# Patient Record
Sex: Male | Born: 1940 | Race: White | Hispanic: No | Marital: Married | Smoking: Never smoker
Health system: Southern US, Community
[De-identification: ages and names within clinical notes are randomized; demographics above are authoritative.]

## PROBLEM LIST (undated history)

## (undated) HISTORY — PX: TONSILLECTOMY: SUR1361

## (undated) HISTORY — PX: CHOLECYSTECTOMY: SHX55

---

## 2007-08-20 ENCOUNTER — Ambulatory Visit: Payer: Self-pay | Admitting: Internal Medicine

## 2017-12-17 ENCOUNTER — Ambulatory Visit (INDEPENDENT_AMBULATORY_CARE_PROVIDER_SITE_OTHER): Payer: Medicare PPO

## 2017-12-17 ENCOUNTER — Ambulatory Visit
Admission: EM | Admit: 2017-12-17 | Discharge: 2017-12-17 | Disposition: A | Discharge status: Home / Self Care | Payer: Medicare PPO | Attending: Emergency Medicine | Admitting: Emergency Medicine

## 2017-12-17 ENCOUNTER — Other Ambulatory Visit: Payer: Self-pay

## 2017-12-17 DIAGNOSIS — R51 Headache: Secondary | ICD-10-CM | POA: Diagnosis not present

## 2017-12-17 DIAGNOSIS — R062 Wheezing: Secondary | ICD-10-CM | POA: Diagnosis not present

## 2017-12-17 DIAGNOSIS — R05 Cough: Secondary | ICD-10-CM | POA: Diagnosis not present

## 2017-12-17 DIAGNOSIS — J22 Unspecified acute lower respiratory infection: Secondary | ICD-10-CM

## 2017-12-17 DIAGNOSIS — R0989 Other specified symptoms and signs involving the circulatory and respiratory systems: Secondary | ICD-10-CM | POA: Diagnosis not present

## 2017-12-17 MED ORDER — IPRATROPIUM-ALBUTEROL 0.5-2.5 (3) MG/3ML IN SOLN
3.0000 mL | Freq: Once | RESPIRATORY_TRACT | Status: AC
  Administered 2017-12-17: 3 mL via RESPIRATORY_TRACT

## 2017-12-17 MED ORDER — AZITHROMYCIN 250 MG PO TABS: 250.0000 mg | ORAL_TABLET | Freq: Every day | ORAL | 0 refills | Status: DC

## 2017-12-17 MED ORDER — PREDNISONE 50 MG PO TABS
60.0000 mg | ORAL_TABLET | Freq: Once | ORAL | Status: AC
  Administered 2017-12-17: 60 mg via ORAL

## 2017-12-17 MED ORDER — ALBUTEROL SULFATE HFA 108 (90 BASE) MCG/ACT IN AERS: 1.0000 | INHALATION_SPRAY | Freq: Four times a day (QID) | RESPIRATORY_TRACT | 0 refills | Status: DC | PRN

## 2017-12-17 MED ORDER — AEROCHAMBER PLUS MISC: 2 refills | Status: DC

## 2017-12-17 MED ORDER — PREDNISONE 20 MG PO TABS: 40.0000 mg | ORAL_TABLET | Freq: Every day | ORAL | 0 refills | Status: AC

## 2017-12-17 NOTE — ED Triage Notes (Signed)
Patient complains of cough and congestion x 1 week with worsening symptoms.

## 2017-12-17 NOTE — Discharge Instructions (Addendum)
2 puffs every 4-6 hours from your albuterol inhaler using your spacer, start some saline nasal irrigation with a Lloyd HugerNeil med sinus rinse and distilled water, 40 mg prednisone for an additional 5 days and start this tomorrow.  wait-and-see prescription of azithromycin to cover pneumonia if not better with these medications in 3 days or if you start having fevers above 100.4.  Here is a list of primary care providers who are taking new patients:  Dr. Elizabeth Sauereanna Jones, Dr. Schuyler AmorWilliam Plonk [address] [place] [address] [phone]  [facility] [place] [address] [place] [address]  [place] [address]  [phone]  [facility] [address]  [place], [place] [address] [phone]  [facility] [place] [address]  [phone] [place], [place] [address]  Here are clinics/ other resources who will see you if you do not have insurance. Some have certain criteria that you must meet. Call them and find out what they are:  [facility]: [address] [place] [place], [place] [address] Phone: [phone] Hours: First and Third Saturdays of each Month, 9 a.m. - 1 p.m.  [facility]: [address], [place], [place] [address] Phone: [phone] Hours: Tuesday, 4 p.m. - 8 p.m. Thursday, 1 p.m. - 8 p.m. Wednesday, 9 a.m. - [facility]  [facility] [address], [place], [place] [address] Phone: [phone] Pharmacy Phone Number: [phone] Dental Phone Number: [phone] [facility] Insurance Help: [phone]  Dental Hours: Monday - Thursday, 8 a.m. - 6 p.m.  [facility] [address] [place], [place] [address] Phone: [phone] Pharmacy Phone Number: [phone] [facility] Help: [phone]  [facility] [address] [place], [place] [address] Phone: [phone] Pharmacy Phone Number: [phone] [facility] Insurance Help: [phone]  [facility] [address] [place], [place] [address] Phone: [phone] [facility] Insurance Help: [phone]   [facility] [address] [place], [place] [address] Phone: [phone]  Go to www.goodrx.com to look up your medications. This will give you a list of where you can find your prescriptions at the most affordable prices. Or ask the pharmacist what the cash price is, or if they have any other discount programs available to help make your medication more affordable. This can be less expensive than what you would pay with insurance.

## 2017-12-17 NOTE — ED Provider Notes (Signed)
HPI  SUBJECTIVE:  Jacob Zhang is a 77 y.o. male who presents with 1 week of postnasal drip, cough productive of greenish sputum and a mild headache with coughing only.  He denies fevers, sinus pain or pressure, nasal congestion, rhinorrhea, allergy symptoms.  He denies wheezing, chest pain, shortness of breath.  No dyspnea on exertion, body aches, fatigue.  He is able to sleep well at night with NyQuil.  No antibiotics in the past month.  No antipyretic in the past 6 to 8 hours.  No chlorpheniramine-containing products.  No sick contacts.  He has tried NyQuil cold and flu, Mucinex with improvement in symptoms.  No aggravating factors.  Past medical history negative for diabetes, hypertension, asthma emphysema, COPD, smoking, pneumonia, kidney disease.  He is not sure if he has history of allergies.  PMD: None.    History reviewed. No pertinent past medical history.  Past Surgical History:  Procedure Laterality Date  . CHOLECYSTECTOMY    . TONSILLECTOMY      History reviewed. No pertinent family history.  Social History   Tobacco Use  . Smoking status: Never Smoker  . Smokeless tobacco: Never Used  Substance Use Topics  . Alcohol use: Never    Frequency: Never  . Drug use: Never    No current facility-administered medications for this encounter.   Current Outpatient Medications:  .  albuterol (PROVENTIL HFA;VENTOLIN HFA) 108 (90 Base) MCG/ACT inhaler, Inhale 1-2 puffs into the lungs every 6 (six) hours as needed for wheezing or shortness of breath., Disp: 1 Inhaler, Rfl: 0 .  azithromycin (ZITHROMAX) 250 MG tablet, Take 1 tablet (250 mg total) by mouth daily. 2 tabs po on day 1, 1 tab po on days 2-5, Disp: 6 tablet, Rfl: 0 .  predniSONE (DELTASONE) 20 MG tablet, Take 2 tablets (40 mg total) by mouth daily with breakfast for 5 days., Disp: 10 tablet, Rfl: 0 .  Spacer/Aero-Holding Chambers (AEROCHAMBER PLUS) inhaler, Use as instructed, Disp: 1 each, Rfl: 2  No Known  Allergies   ROS  As noted in HPI.   Physical Exam  BP (!) 150/81 (BP Location: Right Arm)   Pulse 78   Temp 98.4 F (36.9 C) (Oral)   Resp 18   Ht 5\' 9"  (1.753 m)   Wt 160 lb (72.6 kg)   SpO2 99%   BMI 23.63 kg/m   Constitutional: Well developed, well nourished, no acute distress Eyes:  EOMI, conjunctiva normal bilaterally HENT: Normocephalic, atraumatic,mucus membranes moist.  Positive clear rhinorrhea.  Erythematous, swollen turbinates.  No sinus tenderness.  Positive cobblestoning.  No postnasal drip. Respiratory: Normal inspiratory effort, diffuse wheezing throughout all lung fields, occasional rhonchi.   Cardiovascular: Normal rate regular rhythm, no murmurs, rubs, gallops. GI: nondistended skin: No rash, skin intact Musculoskeletal: no deformities Neurologic: Alert & oriented x 3, no focal neuro deficits Psychiatric: Speech and behavior appropriate   ED Course   Medications  ipratropium-albuterol (DUONEB) 0.5-2.5 (3) MG/3ML nebulizer solution 3 mL (3 mLs Nebulization Given 12/17/17 1601)  predniSONE (DELTASONE) tablet 60 mg (60 mg Oral Given 12/17/17 1601)    Orders Placed This Encounter  Procedures  . DG Chest 2 View    Standing Status:   Standing    Number of Occurrences:   1    Order Specific Question:   Reason for Exam (SYMPTOM  OR DIAGNOSIS REQUIRED)    Answer:   cough wheeze r/o pNA    No results found for this or any previous visit (from  the past 24 hour(s)). Dg Chest 2 View  Result Date: 12/17/2017 CLINICAL DATA:  Cough, wheezing for 1 week EXAM: CHEST - 2 VIEW COMPARISON:  None. FINDINGS: No active infiltrate or effusion is seen. Mediastinal and hilar contours are unremarkable. The heart is within upper limits of normal. No bony abnormality is seen. Surgical clips are present in the right upper quadrant from prior cholecystectomy. IMPRESSION: No active cardiopulmonary disease. Electronically Signed   By: Dwyane Dee M.D.   On: 12/17/2017 16:31    ED  Clinical Impression  Lower respiratory tract infection   ED Assessment/Plan  Presentation consistent with a lower respiratory infection.  Will give prednisone 60 mg, DuoNeb because of the wheezing and rhonchi, obtain a chest x-ray because of the focal lung findings and his age, and reevaluate.  Plan to send home with an albuterol inhaler with a spacer 2 puffs every 4-6 hours,  saline nasal irrigation, 40 mg prednisone for an additional 5 days and a wait-and-see prescription of azithromycin to cover pneumonia if not better with these medications.  Reviewed imaging independently.  No pneumonia.  See radiology report for full details.  On Reevaluation, patient states he feels better.  Wheezes at bilateral bases.  No rhonchi. plan as above.  Discussed imaging, MDM, treatment plan, and plan for follow-up with patient. Discussed sn/sx that should prompt return to the ED. patient agrees with plan.   Meds ordered this encounter  Medications  . ipratropium-albuterol (DUONEB) 0.5-2.5 (3) MG/3ML nebulizer solution 3 mL  . predniSONE (DELTASONE) tablet 60 mg  . albuterol (PROVENTIL HFA;VENTOLIN HFA) 108 (90 Base) MCG/ACT inhaler    Sig: Inhale 1-2 puffs into the lungs every 6 (six) hours as needed for wheezing or shortness of breath.    Dispense:  1 Inhaler    Refill:  0  . Spacer/Aero-Holding Chambers (AEROCHAMBER PLUS) inhaler    Sig: Use as instructed    Dispense:  1 each    Refill:  2  . predniSONE (DELTASONE) 20 MG tablet    Sig: Take 2 tablets (40 mg total) by mouth daily with breakfast for 5 days.    Dispense:  10 tablet    Refill:  0  . azithromycin (ZITHROMAX) 250 MG tablet    Sig: Take 1 tablet (250 mg total) by mouth daily. 2 tabs po on day 1, 1 tab po on days 2-5    Dispense:  6 tablet    Refill:  0    *This clinic note was created using Scientist, clinical (histocompatibility and immunogenetics). Therefore, there may be occasional mistakes despite careful proofreading.   ?   Domenick Gong, MD 12/17/17  1801

## 2020-04-08 ENCOUNTER — Other Ambulatory Visit: Payer: Self-pay | Admitting: Family Medicine

## 2020-04-08 ENCOUNTER — Other Ambulatory Visit (HOSPITAL_COMMUNITY): Payer: Self-pay | Admitting: Family Medicine

## 2020-04-08 DIAGNOSIS — R2232 Localized swelling, mass and lump, left upper limb: Secondary | ICD-10-CM

## 2020-04-12 ENCOUNTER — Ambulatory Visit
Admission: RE | Admit: 2020-04-12 | Discharge: 2020-04-12 | Disposition: A | Discharge status: Home / Self Care | Payer: Medicare PPO | Source: Ambulatory Visit | Attending: Family Medicine | Admitting: Family Medicine

## 2020-04-12 ENCOUNTER — Other Ambulatory Visit: Payer: Self-pay

## 2020-04-12 DIAGNOSIS — R2232 Localized swelling, mass and lump, left upper limb: Secondary | ICD-10-CM | POA: Diagnosis not present

## 2021-01-24 DIAGNOSIS — I209 Angina pectoris, unspecified: Secondary | ICD-10-CM | POA: Diagnosis present

## 2021-02-01 ENCOUNTER — Encounter: Payer: Self-pay | Admitting: Cardiology

## 2021-02-01 ENCOUNTER — Encounter
Admission: RE | Disposition: A | Discharge status: Home / Self Care | Payer: Self-pay | Source: Ambulatory Visit | Attending: Cardiology

## 2021-02-01 ENCOUNTER — Observation Stay
Admission: RE | Admit: 2021-02-01 | Discharge: 2021-02-02 | Disposition: A | Discharge status: Home / Self Care | Payer: Medicare PPO | Source: Ambulatory Visit | Attending: Cardiology | Admitting: Cardiology

## 2021-02-01 ENCOUNTER — Other Ambulatory Visit: Payer: Self-pay

## 2021-02-01 DIAGNOSIS — I2510 Atherosclerotic heart disease of native coronary artery without angina pectoris: Secondary | ICD-10-CM | POA: Diagnosis present

## 2021-02-01 DIAGNOSIS — I209 Angina pectoris, unspecified: Secondary | ICD-10-CM | POA: Diagnosis present

## 2021-02-01 DIAGNOSIS — R9430 Abnormal result of cardiovascular function study, unspecified: Secondary | ICD-10-CM

## 2021-02-01 DIAGNOSIS — R079 Chest pain, unspecified: Secondary | ICD-10-CM

## 2021-02-01 DIAGNOSIS — I10 Essential (primary) hypertension: Secondary | ICD-10-CM | POA: Diagnosis not present

## 2021-02-01 DIAGNOSIS — I25119 Atherosclerotic heart disease of native coronary artery with unspecified angina pectoris: Secondary | ICD-10-CM | POA: Diagnosis not present

## 2021-02-01 HISTORY — PX: LEFT HEART CATH AND CORONARY ANGIOGRAPHY: CATH118249

## 2021-02-01 HISTORY — PX: CORONARY STENT INTERVENTION: CATH118234

## 2021-02-01 LAB — POCT ACTIVATED CLOTTING TIME: Activated Clotting Time: 451 seconds

## 2021-02-01 SURGERY — LEFT HEART CATH AND CORONARY ANGIOGRAPHY
Anesthesia: Moderate Sedation

## 2021-02-01 MED ORDER — MIDAZOLAM HCL 2 MG/2ML IJ SOLN
INTRAMUSCULAR | Status: AC
  Filled 2021-02-01: qty 2

## 2021-02-01 MED ORDER — IOHEXOL 300 MG/ML  SOLN
INTRAMUSCULAR | Status: DC | PRN
Start: 2021-02-01 — End: 2021-02-01
  Administered 2021-02-01: 237 mL via INTRA_ARTERIAL

## 2021-02-01 MED ORDER — LABETALOL HCL 5 MG/ML IV SOLN: 10.0000 mg | INTRAVENOUS | Status: AC | PRN

## 2021-02-01 MED ORDER — ASPIRIN 81 MG PO CHEW
CHEWABLE_TABLET | ORAL | Status: DC | PRN
  Administered 2021-02-01: 243 mg via ORAL

## 2021-02-01 MED ORDER — SODIUM CHLORIDE 0.9 % IV SOLN: 250.0000 mL | INTRAVENOUS | Status: DC | PRN

## 2021-02-01 MED ORDER — TICAGRELOR 90 MG PO TABS
ORAL_TABLET | ORAL | Status: DC | PRN
  Administered 2021-02-01: 180 mg via ORAL

## 2021-02-01 MED ORDER — SODIUM CHLORIDE 0.9% FLUSH
3.0000 mL | Freq: Two times a day (BID) | INTRAVENOUS | Status: DC
  Administered 2021-02-02: 3 mL via INTRAVENOUS

## 2021-02-01 MED ORDER — LIDOCAINE HCL (PF) 1 % IJ SOLN
INTRAMUSCULAR | Status: AC
  Filled 2021-02-01: qty 30

## 2021-02-01 MED ORDER — ASPIRIN 81 MG PO CHEW
81.0000 mg | CHEWABLE_TABLET | ORAL | Status: AC
  Administered 2021-02-01: 81 mg via ORAL

## 2021-02-01 MED ORDER — HEPARIN (PORCINE) IN NACL 2000-0.9 UNIT/L-% IV SOLN
INTRAVENOUS | Status: DC | PRN
  Administered 2021-02-01: 1000 mL

## 2021-02-01 MED ORDER — FENTANYL CITRATE (PF) 100 MCG/2ML IJ SOLN
INTRAMUSCULAR | Status: DC | PRN
  Administered 2021-02-01: 25 ug via INTRAVENOUS

## 2021-02-01 MED ORDER — SODIUM CHLORIDE 0.9 % WEIGHT BASED INFUSION: 1.0000 mL/kg/h | INTRAVENOUS | Status: DC

## 2021-02-01 MED ORDER — LIDOCAINE HCL (PF) 1 % IJ SOLN
INTRAMUSCULAR | Status: DC | PRN
  Administered 2021-02-01: 2 mL

## 2021-02-01 MED ORDER — ASPIRIN 81 MG PO CHEW
CHEWABLE_TABLET | ORAL | Status: AC
  Filled 2021-02-01: qty 3

## 2021-02-01 MED ORDER — MIDAZOLAM HCL 2 MG/2ML IJ SOLN
INTRAMUSCULAR | Status: DC | PRN
  Administered 2021-02-01: 1 mg via INTRAVENOUS

## 2021-02-01 MED ORDER — HEPARIN SODIUM (PORCINE) 1000 UNIT/ML IJ SOLN
INTRAMUSCULAR | Status: DC | PRN
  Administered 2021-02-01: 7000 [IU] via INTRAVENOUS

## 2021-02-01 MED ORDER — NITROGLYCERIN 1 MG/10 ML FOR IR/CATH LAB
INTRA_ARTERIAL | Status: AC
  Filled 2021-02-01: qty 10

## 2021-02-01 MED ORDER — ATORVASTATIN CALCIUM 20 MG PO TABS
40.0000 mg | ORAL_TABLET | Freq: Every day | ORAL | Status: DC
  Administered 2021-02-02: 40 mg via ORAL
  Filled 2021-02-01 (×2): qty 2

## 2021-02-01 MED ORDER — SODIUM CHLORIDE 0.9% FLUSH: 3.0000 mL | INTRAVENOUS | Status: DC | PRN

## 2021-02-01 MED ORDER — HEPARIN SODIUM (PORCINE) 1000 UNIT/ML IJ SOLN
INTRAMUSCULAR | Status: AC
  Filled 2021-02-01: qty 1

## 2021-02-01 MED ORDER — NITROGLYCERIN 1 MG/10 ML FOR IR/CATH LAB
INTRA_ARTERIAL | Status: DC | PRN
  Administered 2021-02-01 (×2): 200 ug via INTRACORONARY

## 2021-02-01 MED ORDER — TICAGRELOR 90 MG PO TABS
90.0000 mg | ORAL_TABLET | Freq: Two times a day (BID) | ORAL | Status: DC
  Administered 2021-02-01 – 2021-02-02 (×2): 90 mg via ORAL
  Filled 2021-02-01 (×2): qty 1

## 2021-02-01 MED ORDER — SODIUM CHLORIDE 0.9% FLUSH
3.0000 mL | Freq: Two times a day (BID) | INTRAVENOUS | Status: DC
  Administered 2021-02-01: 3 mL via INTRAVENOUS

## 2021-02-01 MED ORDER — ASPIRIN 81 MG PO CHEW
CHEWABLE_TABLET | ORAL | Status: AC
  Filled 2021-02-01: qty 1

## 2021-02-01 MED ORDER — FENTANYL CITRATE (PF) 100 MCG/2ML IJ SOLN
INTRAMUSCULAR | Status: AC
  Filled 2021-02-01: qty 2

## 2021-02-01 MED ORDER — SODIUM CHLORIDE 0.9 % WEIGHT BASED INFUSION
3.0000 mL/kg/h | INTRAVENOUS | Status: AC
  Administered 2021-02-01: 3 mL/kg/h via INTRAVENOUS

## 2021-02-01 MED ORDER — IOHEXOL 300 MG/ML  SOLN
INTRAMUSCULAR | Status: DC | PRN
  Administered 2021-02-01: 56 mL

## 2021-02-01 MED ORDER — ACETAMINOPHEN 325 MG PO TABS: 650.0000 mg | ORAL_TABLET | ORAL | Status: DC | PRN

## 2021-02-01 MED ORDER — VERAPAMIL HCL 2.5 MG/ML IV SOLN
INTRAVENOUS | Status: DC | PRN
  Administered 2021-02-01: 2.5 mg via INTRAVENOUS

## 2021-02-01 MED ORDER — ONDANSETRON HCL 4 MG/2ML IJ SOLN: 4.0000 mg | Freq: Four times a day (QID) | INTRAMUSCULAR | Status: DC | PRN

## 2021-02-01 MED ORDER — TICAGRELOR 90 MG PO TABS
ORAL_TABLET | ORAL | Status: AC
  Filled 2021-02-01: qty 2

## 2021-02-01 MED ORDER — HEPARIN SODIUM (PORCINE) 1000 UNIT/ML IJ SOLN
INTRAMUSCULAR | Status: DC | PRN
  Administered 2021-02-01: 3500 [IU] via INTRAVENOUS

## 2021-02-01 MED ORDER — ASPIRIN 81 MG PO CHEW
81.0000 mg | CHEWABLE_TABLET | Freq: Every day | ORAL | Status: DC
  Administered 2021-02-02: 81 mg via ORAL
  Filled 2021-02-01: qty 1

## 2021-02-01 MED ORDER — HYDRALAZINE HCL 20 MG/ML IJ SOLN: 10.0000 mg | INTRAMUSCULAR | Status: AC | PRN

## 2021-02-01 MED ORDER — METOPROLOL SUCCINATE ER 25 MG PO TB24
25.0000 mg | ORAL_TABLET | Freq: Every day | ORAL | Status: DC
  Administered 2021-02-02: 25 mg via ORAL
  Filled 2021-02-01: qty 1

## 2021-02-01 MED ORDER — SODIUM CHLORIDE 0.9 % WEIGHT BASED INFUSION: 1.0000 mL/kg/h | INTRAVENOUS | Status: AC

## 2021-02-01 MED ORDER — VERAPAMIL HCL 2.5 MG/ML IV SOLN
INTRAVENOUS | Status: AC
  Filled 2021-02-01: qty 2

## 2021-02-01 MED ORDER — HEPARIN (PORCINE) IN NACL 1000-0.9 UT/500ML-% IV SOLN
INTRAVENOUS | Status: AC
  Filled 2021-02-01: qty 1000

## 2021-02-01 SURGICAL SUPPLY — 22 items
BALLN EUPHORA RX 2.0X15 (BALLOONS) ×2
BALLN TREK RX 2.25X15 (BALLOONS) ×2
BALLOON EUPHORA RX 2.0X15 (BALLOONS) ×1 IMPLANT
BALLOON TREK RX 2.25X15 (BALLOONS) ×1 IMPLANT
CATH INFINITI 5 FR JL3.5 (CATHETERS) ×2 IMPLANT
CATH INFINITI JR4 5F (CATHETERS) ×2 IMPLANT
CATH VISTA GUIDE 6FR XB3 (CATHETERS) ×2 IMPLANT
CATH VISTA GUIDE 6FR XB3 SH (CATHETERS) ×2 IMPLANT
CATH VISTA GUIDE 6FR XB3.5 (CATHETERS) ×2 IMPLANT
DEVICE RAD TR BAND REGULAR (VASCULAR PRODUCTS) ×2 IMPLANT
DRAPE BRACHIAL (DRAPES) ×2 IMPLANT
GLIDESHEATH SLEND SS 6F .021 (SHEATH) ×2 IMPLANT
GUIDEWIRE INQWIRE 1.5J.035X260 (WIRE) ×2 IMPLANT
INQWIRE 1.5J .035X260CM (WIRE) ×4
KIT ENCORE 26 ADVANTAGE (KITS) ×2 IMPLANT
PACK CARDIAC CATH (CUSTOM PROCEDURE TRAY) ×2 IMPLANT
PROTECTION STATION PRESSURIZED (MISCELLANEOUS) ×2
SET ATX SIMPLICITY (MISCELLANEOUS) ×2 IMPLANT
STATION PROTECTION PRESSURIZED (MISCELLANEOUS) ×1 IMPLANT
STENT ONYX FRONTIER 2.75X18 (Permanent Stent) ×2 IMPLANT
TUBING CIL FLEX 10 FLL-RA (TUBING) ×2 IMPLANT
WIRE ASAHI PROWATER 180CM (WIRE) ×2 IMPLANT

## 2021-02-02 ENCOUNTER — Encounter: Payer: Self-pay | Admitting: Internal Medicine

## 2021-02-02 DIAGNOSIS — I25119 Atherosclerotic heart disease of native coronary artery with unspecified angina pectoris: Secondary | ICD-10-CM | POA: Diagnosis not present

## 2021-02-02 LAB — CBC
HCT: 41.4 % (ref 39.0–52.0)
Hemoglobin: 14.5 g/dL (ref 13.0–17.0)
MCH: 31.4 pg (ref 26.0–34.0)
MCHC: 35 g/dL (ref 30.0–36.0)
MCV: 89.6 fL (ref 80.0–100.0)
Platelets: 146 10*3/uL — ABNORMAL LOW (ref 150–400)
RBC: 4.62 MIL/uL (ref 4.22–5.81)
RDW: 13.7 % (ref 11.5–15.5)
WBC: 6.9 10*3/uL (ref 4.0–10.5)
nRBC: 0 % (ref 0.0–0.2)

## 2021-02-02 LAB — BASIC METABOLIC PANEL
Anion gap: 7 (ref 5–15)
BUN: 14 mg/dL (ref 8–23)
CO2: 25 mmol/L (ref 22–32)
Calcium: 9 mg/dL (ref 8.9–10.3)
Chloride: 108 mmol/L (ref 98–111)
Creatinine, Ser: 0.76 mg/dL (ref 0.61–1.24)
GFR, Estimated: >60 mL/min (ref >60)
Glucose, Bld: 90 mg/dL (ref 70–99)
Potassium: 3.7 mmol/L (ref 3.5–5.1)
Sodium: 140 mmol/L (ref 135–145)

## 2021-02-02 MED ORDER — TICAGRELOR 90 MG PO TABS: 90.0000 mg | ORAL_TABLET | Freq: Two times a day (BID) | ORAL | 6 refills | Status: AC

## 2021-02-02 MED ORDER — ATORVASTATIN CALCIUM 40 MG PO TABS: 40.0000 mg | ORAL_TABLET | Freq: Every day | ORAL | 4 refills | Status: AC

## 2021-02-02 MED ORDER — ASPIRIN 81 MG PO CHEW: 81.0000 mg | CHEWABLE_TABLET | Freq: Every day | ORAL | 4 refills | Status: AC

## 2021-02-02 MED ORDER — METOPROLOL SUCCINATE ER 25 MG PO TB24: 25.0000 mg | ORAL_TABLET | Freq: Every day | ORAL | 4 refills | Status: AC

## 2021-02-02 NOTE — Plan of Care (Signed)

## 2021-02-02 NOTE — Discharge Summary (Signed)
[facility] Cardiology Discharge Summary  Patient ID: Jacob Zhang MRN: [id number] DOB/AGE: 02-24-1941 80 y.o.  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  Hospital Course: The patient was admitted to specials for cardiac cath with selective coronary angiogram after full consent, risk and benefits explained, and time out called with all approprate details voiced and discussed. The patient has had progressive canadian class 4 angina with high probability risk stress test consistent with ischemic chest pain and or anginal equivalent with coronary artery risk factors including high blood pressure and high cholesterol. The procedure was performed without complication and it revealed normal left ventricular function with ejection fraction of 60%.  It was found that the patient had severe 1 vessel coronary atherosclerosis with significant left anterior descending stenosis requiring further intervention. Therefore, the patient had a PCI and drug eluding  stent placed without complication. The patient has been ambulating without further significant symptoms and has reached his maximal hospital benefit and will be discharged to home in good condition.  Cardiac rehabilitation has been discussed and recommended. Medication management of cardiovascular risk factors will be given post discharge and modified as an outpatient.   Discharge Exam: Blood pressure 131/83, pulse 75, temperature 98.1 F (36.7 C), resp. rate 16, height 5\' 9"  (1.753 m), weight 68.5 kg, SpO2 98 %.  Constitutional: Alet oriented to person, place, and time. No distress.  HENT: No nasal  discharge.  Head: Normocephalic and atraumatic.  Eyes: Pupils are equal and round. No discharge.  Neck: Normal range of motion. Neck supple. No JVD present. No thyromegaly present.  Cardiovascular: Normal rate, regular rhythm, normal S1 S2, no gallop, no friction rub. No murmur Pulmonary/Chest: Effort normal, No stridor. No respiratory distress. no wheezes.  no rales.    Abdominal: Soft. Bowel sounds are normal.  no distension.  no tenderness. There is no rebound and no guarding.  Musculoskeletal: No edema, no cyanosis, normal pulses, no bleeding, Normal range of motion. no tenderness.  Neurological:  alert and oriented to person, place, and time. Coordination normal.  Skin: Skin is warm and dry. No rash noted. No erythema. No pallor.  Psychiatric:  normal mood and affect. behavior is normal.    Labs:   Lab Results  Component Value Date   WBC 6.9 02/02/2021   HGB 14.5 02/02/2021   HCT 41.4 02/02/2021   MCV 89.6 02/02/2021   PLT 146 (L) 02/02/2021    Recent Labs  Lab 02/02/21 0454  NA 140  K 3.7  CL 108  CO2 25  BUN 14  CREATININE 0.76  CALCIUM 9.0  GLUCOSE 90    EKG: NSR without evidence of new changes  FOLLOW UP IN ONE TO TWO WEEKS Discharge Instructions     AMB Referral to Cardiac Rehabilitation - Phase II   Complete by: As directed    Diagnosis: Coronary Stents   After initial evaluation and assessments completed: Virtual Based Care may be provided alone or in conjunction with Phase 2 Cardiac Rehab based on patient barriers.: Yes      Allergies as of 02/02/2021   No Known Allergies      Medication List     STOP taking these medications    AeroChamber Plus  inhaler   albuterol 108 (90 Base) MCG/ACT inhaler Commonly known as: VENTOLIN HFA   azithromycin 250 MG tablet Commonly known as: ZITHROMAX   cholecalciferol 25 MCG (1000 UNIT) tablet Commonly known as: VITAMIN D3   multivitamin-lutein Caps capsule       TAKE these medications    aspirin  81 MG chewable tablet Chew 1 tablet (81 mg total) by mouth daily.   atorvastatin 40 MG tablet Commonly known as: LIPITOR Take 1 tablet (40 mg total) by mouth daily.   metoprolol succinate 25 MG 24 hr tablet Commonly known as: TOPROL-XL Take 1 tablet (25 mg total) by mouth daily.   ticagrelor 90 MG Tabs tablet Commonly known as: BRILINTA Take 1 tablet (90 mg total) by mouth 2 (two) times daily.        Follow-up Information     Lamar Blinks, MD Follow up in 1 week(s).   Specialty: Cardiology Contact information: [address] [facility] [place] [address] [phone]                 THE PATIENT  SHALL BRING ALL MEDICATIONS TO FOLLOW UP APPOINTMENT  Signed:  Lamar Blinks MD, [facility] 02/02/2021, 7:53 AM

## 2021-09-06 DIAGNOSIS — M5442 Lumbago with sciatica, left side: Secondary | ICD-10-CM | POA: Diagnosis not present

## 2021-09-06 DIAGNOSIS — M9901 Segmental and somatic dysfunction of cervical region: Secondary | ICD-10-CM | POA: Diagnosis not present

## 2021-09-06 DIAGNOSIS — M9905 Segmental and somatic dysfunction of pelvic region: Secondary | ICD-10-CM | POA: Diagnosis not present

## 2021-09-06 DIAGNOSIS — M955 Acquired deformity of pelvis: Secondary | ICD-10-CM | POA: Diagnosis not present

## 2021-09-06 DIAGNOSIS — M9903 Segmental and somatic dysfunction of lumbar region: Secondary | ICD-10-CM | POA: Diagnosis not present

## 2021-09-06 DIAGNOSIS — M531 Cervicobrachial syndrome: Secondary | ICD-10-CM | POA: Diagnosis not present

## 2021-09-13 DIAGNOSIS — L821 Other seborrheic keratosis: Secondary | ICD-10-CM | POA: Diagnosis not present

## 2021-09-13 DIAGNOSIS — D485 Neoplasm of uncertain behavior of skin: Secondary | ICD-10-CM | POA: Diagnosis not present

## 2021-09-13 DIAGNOSIS — L570 Actinic keratosis: Secondary | ICD-10-CM | POA: Diagnosis not present

## 2021-09-27 DIAGNOSIS — M9903 Segmental and somatic dysfunction of lumbar region: Secondary | ICD-10-CM | POA: Diagnosis not present

## 2021-09-27 DIAGNOSIS — M5136 Other intervertebral disc degeneration, lumbar region: Secondary | ICD-10-CM | POA: Diagnosis not present

## 2021-09-27 DIAGNOSIS — M546 Pain in thoracic spine: Secondary | ICD-10-CM | POA: Diagnosis not present

## 2021-09-27 DIAGNOSIS — M531 Cervicobrachial syndrome: Secondary | ICD-10-CM | POA: Diagnosis not present

## 2021-09-27 DIAGNOSIS — M9902 Segmental and somatic dysfunction of thoracic region: Secondary | ICD-10-CM | POA: Diagnosis not present

## 2021-09-27 DIAGNOSIS — M9901 Segmental and somatic dysfunction of cervical region: Secondary | ICD-10-CM | POA: Diagnosis not present

## 2021-11-01 DIAGNOSIS — M9903 Segmental and somatic dysfunction of lumbar region: Secondary | ICD-10-CM | POA: Diagnosis not present

## 2021-11-01 DIAGNOSIS — M5136 Other intervertebral disc degeneration, lumbar region: Secondary | ICD-10-CM | POA: Diagnosis not present

## 2021-11-01 DIAGNOSIS — M9902 Segmental and somatic dysfunction of thoracic region: Secondary | ICD-10-CM | POA: Diagnosis not present

## 2021-11-01 DIAGNOSIS — M531 Cervicobrachial syndrome: Secondary | ICD-10-CM | POA: Diagnosis not present

## 2021-11-01 DIAGNOSIS — M546 Pain in thoracic spine: Secondary | ICD-10-CM | POA: Diagnosis not present

## 2021-11-01 DIAGNOSIS — M9901 Segmental and somatic dysfunction of cervical region: Secondary | ICD-10-CM | POA: Diagnosis not present

## 2021-12-06 DIAGNOSIS — M531 Cervicobrachial syndrome: Secondary | ICD-10-CM | POA: Diagnosis not present

## 2021-12-06 DIAGNOSIS — M9901 Segmental and somatic dysfunction of cervical region: Secondary | ICD-10-CM | POA: Diagnosis not present

## 2021-12-06 DIAGNOSIS — M546 Pain in thoracic spine: Secondary | ICD-10-CM | POA: Diagnosis not present

## 2021-12-06 DIAGNOSIS — M5136 Other intervertebral disc degeneration, lumbar region: Secondary | ICD-10-CM | POA: Diagnosis not present

## 2021-12-06 DIAGNOSIS — M9902 Segmental and somatic dysfunction of thoracic region: Secondary | ICD-10-CM | POA: Diagnosis not present

## 2021-12-06 DIAGNOSIS — M9903 Segmental and somatic dysfunction of lumbar region: Secondary | ICD-10-CM | POA: Diagnosis not present

## 2021-12-26 DIAGNOSIS — I2510 Atherosclerotic heart disease of native coronary artery without angina pectoris: Secondary | ICD-10-CM | POA: Diagnosis not present

## 2021-12-26 DIAGNOSIS — E782 Mixed hyperlipidemia: Secondary | ICD-10-CM | POA: Diagnosis not present

## 2022-01-17 DIAGNOSIS — M5136 Other intervertebral disc degeneration, lumbar region: Secondary | ICD-10-CM | POA: Diagnosis not present

## 2022-01-17 DIAGNOSIS — M531 Cervicobrachial syndrome: Secondary | ICD-10-CM | POA: Diagnosis not present

## 2022-01-17 DIAGNOSIS — M9901 Segmental and somatic dysfunction of cervical region: Secondary | ICD-10-CM | POA: Diagnosis not present

## 2022-01-17 DIAGNOSIS — M9903 Segmental and somatic dysfunction of lumbar region: Secondary | ICD-10-CM | POA: Diagnosis not present

## 2022-01-17 DIAGNOSIS — M9902 Segmental and somatic dysfunction of thoracic region: Secondary | ICD-10-CM | POA: Diagnosis not present

## 2022-01-17 DIAGNOSIS — M546 Pain in thoracic spine: Secondary | ICD-10-CM | POA: Diagnosis not present

## 2022-02-14 DIAGNOSIS — M531 Cervicobrachial syndrome: Secondary | ICD-10-CM | POA: Diagnosis not present

## 2022-02-14 DIAGNOSIS — M546 Pain in thoracic spine: Secondary | ICD-10-CM | POA: Diagnosis not present

## 2022-02-14 DIAGNOSIS — M9903 Segmental and somatic dysfunction of lumbar region: Secondary | ICD-10-CM | POA: Diagnosis not present

## 2022-02-14 DIAGNOSIS — M5136 Other intervertebral disc degeneration, lumbar region: Secondary | ICD-10-CM | POA: Diagnosis not present

## 2022-02-14 DIAGNOSIS — M9902 Segmental and somatic dysfunction of thoracic region: Secondary | ICD-10-CM | POA: Diagnosis not present

## 2022-02-14 DIAGNOSIS — M9901 Segmental and somatic dysfunction of cervical region: Secondary | ICD-10-CM | POA: Diagnosis not present

## 2022-03-14 DIAGNOSIS — M9901 Segmental and somatic dysfunction of cervical region: Secondary | ICD-10-CM | POA: Diagnosis not present

## 2022-03-14 DIAGNOSIS — M5136 Other intervertebral disc degeneration, lumbar region: Secondary | ICD-10-CM | POA: Diagnosis not present

## 2022-03-14 DIAGNOSIS — M531 Cervicobrachial syndrome: Secondary | ICD-10-CM | POA: Diagnosis not present

## 2022-03-14 DIAGNOSIS — M546 Pain in thoracic spine: Secondary | ICD-10-CM | POA: Diagnosis not present

## 2022-03-14 DIAGNOSIS — M9902 Segmental and somatic dysfunction of thoracic region: Secondary | ICD-10-CM | POA: Diagnosis not present

## 2022-03-14 DIAGNOSIS — M9903 Segmental and somatic dysfunction of lumbar region: Secondary | ICD-10-CM | POA: Diagnosis not present

## 2022-03-21 DIAGNOSIS — F17211 Nicotine dependence, cigarettes, in remission: Secondary | ICD-10-CM | POA: Diagnosis not present

## 2022-03-21 DIAGNOSIS — Z008 Encounter for other general examination: Secondary | ICD-10-CM | POA: Diagnosis not present

## 2022-03-21 DIAGNOSIS — I10 Essential (primary) hypertension: Secondary | ICD-10-CM | POA: Diagnosis not present

## 2022-03-21 DIAGNOSIS — E785 Hyperlipidemia, unspecified: Secondary | ICD-10-CM | POA: Diagnosis not present

## 2022-03-21 DIAGNOSIS — R2681 Unsteadiness on feet: Secondary | ICD-10-CM | POA: Diagnosis not present

## 2022-03-21 DIAGNOSIS — Z6824 Body mass index (BMI) 24.0-24.9, adult: Secondary | ICD-10-CM | POA: Diagnosis not present

## 2022-03-21 DIAGNOSIS — I2510 Atherosclerotic heart disease of native coronary artery without angina pectoris: Secondary | ICD-10-CM | POA: Diagnosis not present

## 2022-04-09 DIAGNOSIS — M25512 Pain in left shoulder: Secondary | ICD-10-CM | POA: Diagnosis not present

## 2022-04-09 DIAGNOSIS — R0981 Nasal congestion: Secondary | ICD-10-CM | POA: Diagnosis not present

## 2022-04-09 DIAGNOSIS — Z0000 Encounter for general adult medical examination without abnormal findings: Secondary | ICD-10-CM | POA: Diagnosis not present

## 2022-04-09 DIAGNOSIS — I2510 Atherosclerotic heart disease of native coronary artery without angina pectoris: Secondary | ICD-10-CM | POA: Diagnosis not present

## 2022-04-09 DIAGNOSIS — R351 Nocturia: Secondary | ICD-10-CM | POA: Diagnosis not present

## 2022-04-10 DIAGNOSIS — M7582 Other shoulder lesions, left shoulder: Secondary | ICD-10-CM | POA: Diagnosis not present

## 2022-04-11 DIAGNOSIS — M531 Cervicobrachial syndrome: Secondary | ICD-10-CM | POA: Diagnosis not present

## 2022-04-11 DIAGNOSIS — M546 Pain in thoracic spine: Secondary | ICD-10-CM | POA: Diagnosis not present

## 2022-04-11 DIAGNOSIS — M9902 Segmental and somatic dysfunction of thoracic region: Secondary | ICD-10-CM | POA: Diagnosis not present

## 2022-04-11 DIAGNOSIS — M9901 Segmental and somatic dysfunction of cervical region: Secondary | ICD-10-CM | POA: Diagnosis not present

## 2022-04-11 DIAGNOSIS — M5136 Other intervertebral disc degeneration, lumbar region: Secondary | ICD-10-CM | POA: Diagnosis not present

## 2022-04-11 DIAGNOSIS — M9903 Segmental and somatic dysfunction of lumbar region: Secondary | ICD-10-CM | POA: Diagnosis not present

## 2022-05-16 DIAGNOSIS — M9903 Segmental and somatic dysfunction of lumbar region: Secondary | ICD-10-CM | POA: Diagnosis not present

## 2022-05-16 DIAGNOSIS — M5136 Other intervertebral disc degeneration, lumbar region: Secondary | ICD-10-CM | POA: Diagnosis not present

## 2022-05-16 DIAGNOSIS — M531 Cervicobrachial syndrome: Secondary | ICD-10-CM | POA: Diagnosis not present

## 2022-05-16 DIAGNOSIS — M546 Pain in thoracic spine: Secondary | ICD-10-CM | POA: Diagnosis not present

## 2022-05-16 DIAGNOSIS — M9901 Segmental and somatic dysfunction of cervical region: Secondary | ICD-10-CM | POA: Diagnosis not present

## 2022-05-16 DIAGNOSIS — M9902 Segmental and somatic dysfunction of thoracic region: Secondary | ICD-10-CM | POA: Diagnosis not present

## 2022-06-20 DIAGNOSIS — M5136 Other intervertebral disc degeneration, lumbar region: Secondary | ICD-10-CM | POA: Diagnosis not present

## 2022-06-20 DIAGNOSIS — M9903 Segmental and somatic dysfunction of lumbar region: Secondary | ICD-10-CM | POA: Diagnosis not present

## 2022-06-20 DIAGNOSIS — M546 Pain in thoracic spine: Secondary | ICD-10-CM | POA: Diagnosis not present

## 2022-06-20 DIAGNOSIS — M9901 Segmental and somatic dysfunction of cervical region: Secondary | ICD-10-CM | POA: Diagnosis not present

## 2022-06-20 DIAGNOSIS — M9902 Segmental and somatic dysfunction of thoracic region: Secondary | ICD-10-CM | POA: Diagnosis not present

## 2022-06-20 DIAGNOSIS — M531 Cervicobrachial syndrome: Secondary | ICD-10-CM | POA: Diagnosis not present

## 2022-07-25 DIAGNOSIS — M531 Cervicobrachial syndrome: Secondary | ICD-10-CM | POA: Diagnosis not present

## 2022-07-25 DIAGNOSIS — M9901 Segmental and somatic dysfunction of cervical region: Secondary | ICD-10-CM | POA: Diagnosis not present

## 2022-07-25 DIAGNOSIS — M9902 Segmental and somatic dysfunction of thoracic region: Secondary | ICD-10-CM | POA: Diagnosis not present

## 2022-07-25 DIAGNOSIS — M9903 Segmental and somatic dysfunction of lumbar region: Secondary | ICD-10-CM | POA: Diagnosis not present

## 2022-07-25 DIAGNOSIS — M5136 Other intervertebral disc degeneration, lumbar region: Secondary | ICD-10-CM | POA: Diagnosis not present

## 2022-07-25 DIAGNOSIS — M546 Pain in thoracic spine: Secondary | ICD-10-CM | POA: Diagnosis not present

## 2022-08-15 IMAGING — US US EXTREM UP*L* LTD
1 series · 12 of 12 positions shown · non-contrast
Comparison: None.

CLINICAL DATA: The patient reports a mass on the left upper arm for
6 weeks.

EXAM:
ULTRASOUND LEFT UPPER EXTREMITY LIMITED
TECHNIQUE: Ultrasound examination of the upper extremity soft tissues was
performed in the area of clinical concern.

[Series 1: us extrem up*left* ltd · 0.07mm/px · 12 acquisitions, 12 frames shown]
[im 1/12]
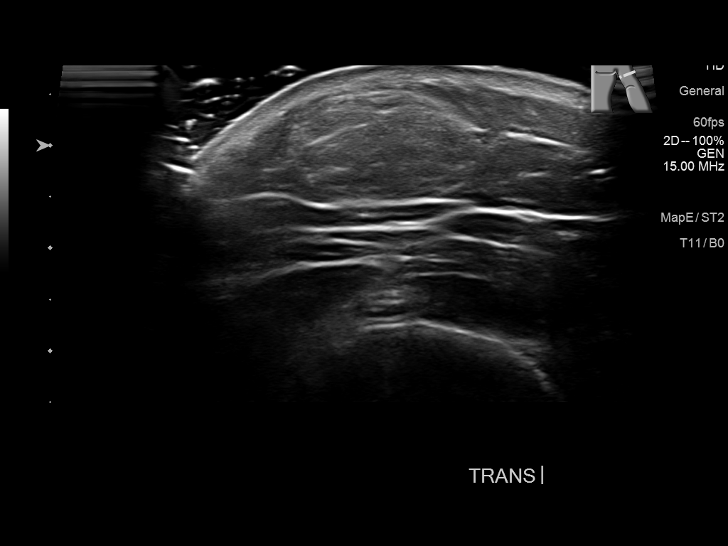
[im 2/12]
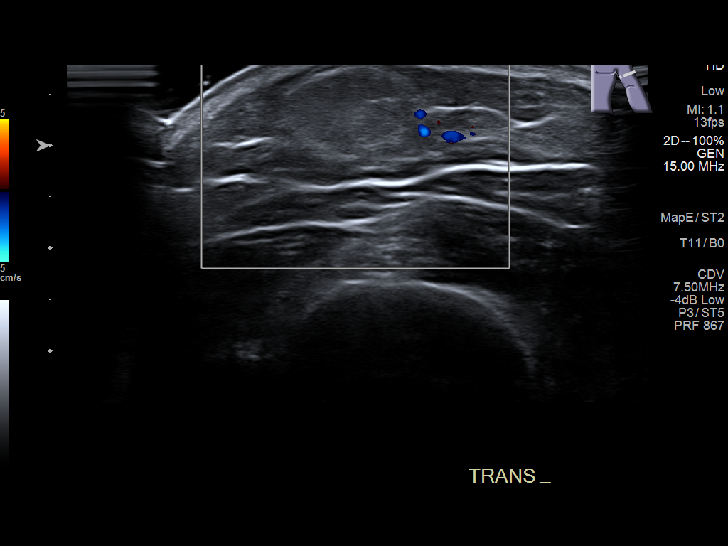
[im 3/12]
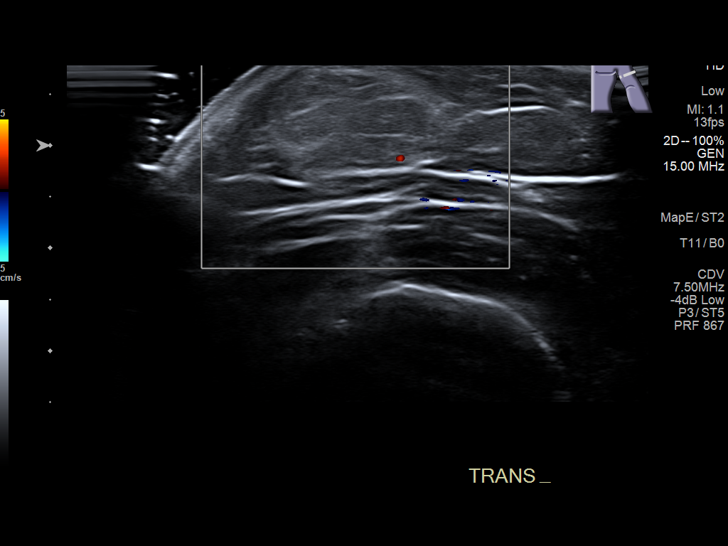
[im 4/12]
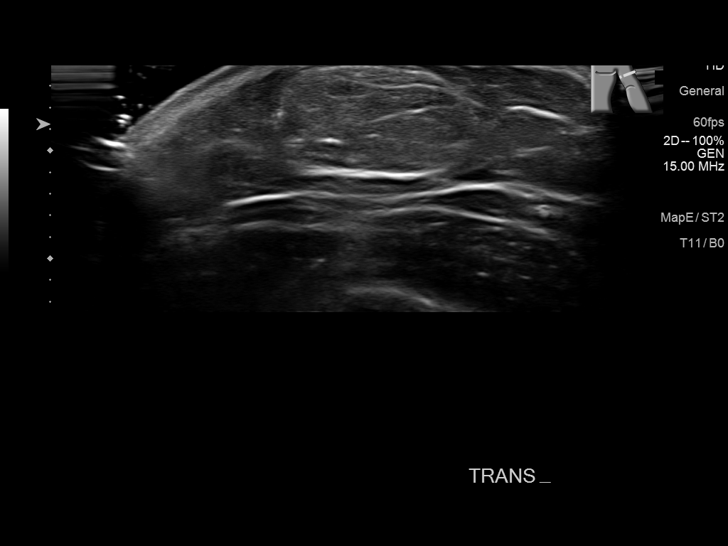
[im 5/12]
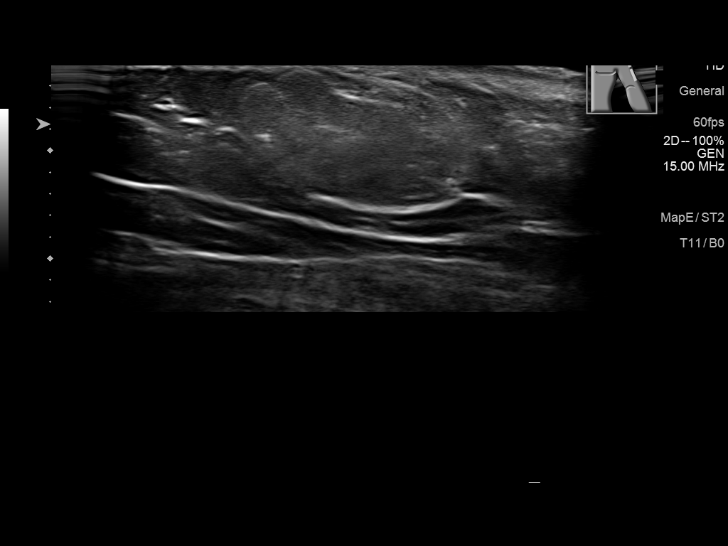
[im 6/12]
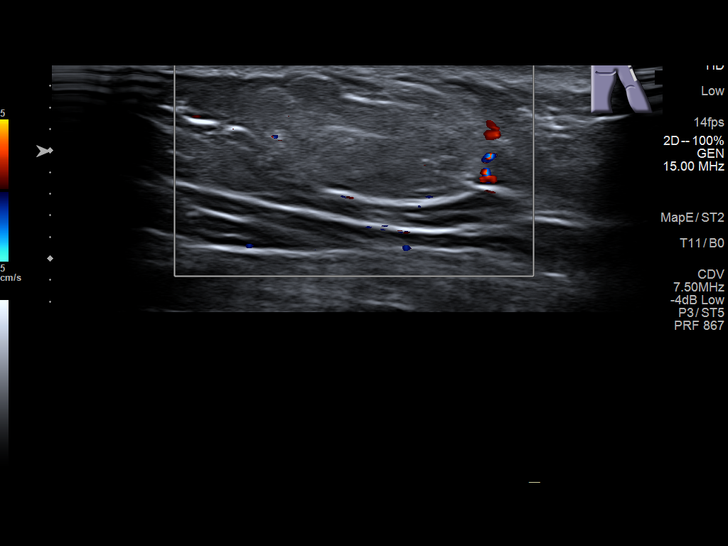
[im 7/12]
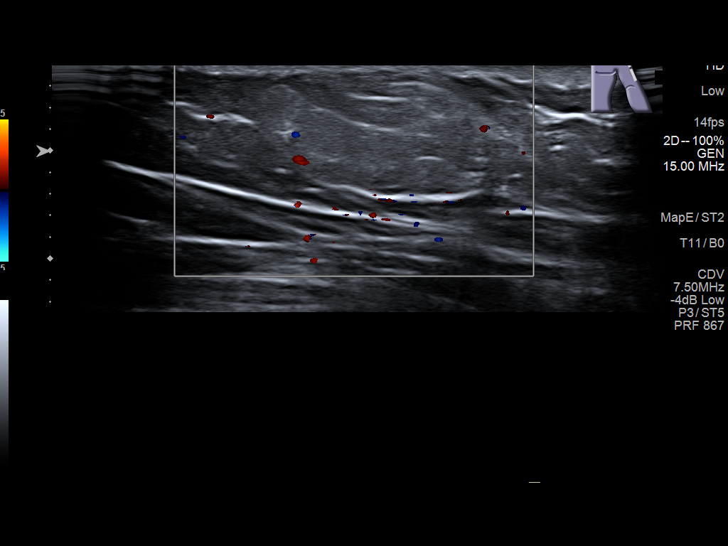
[im 8/12]
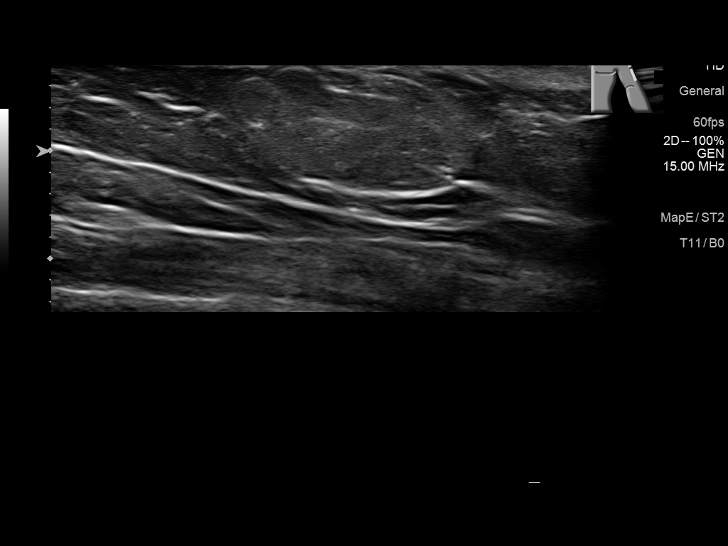
[im 9/12]
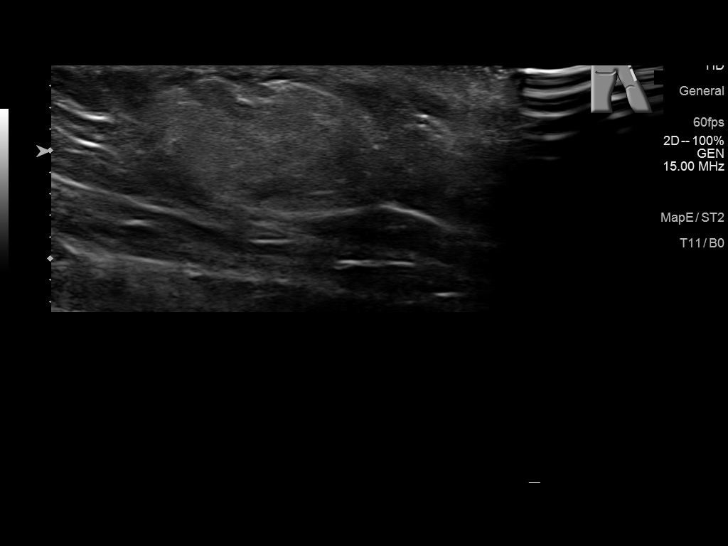
[im 10/12]
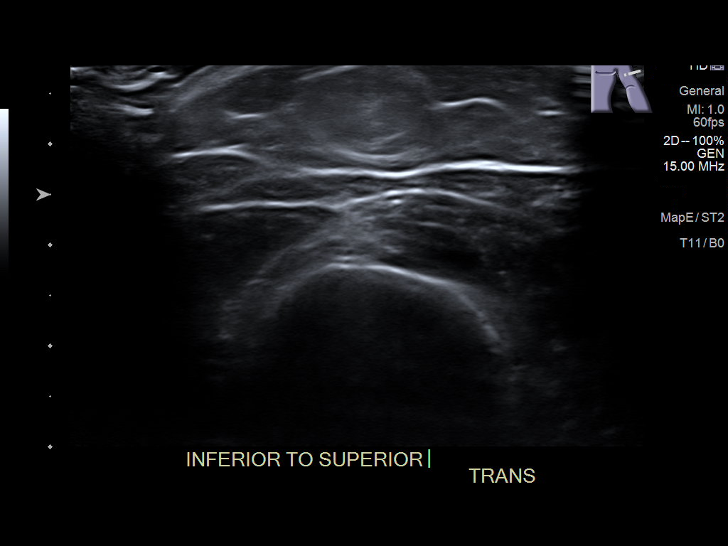
[im 11/12]
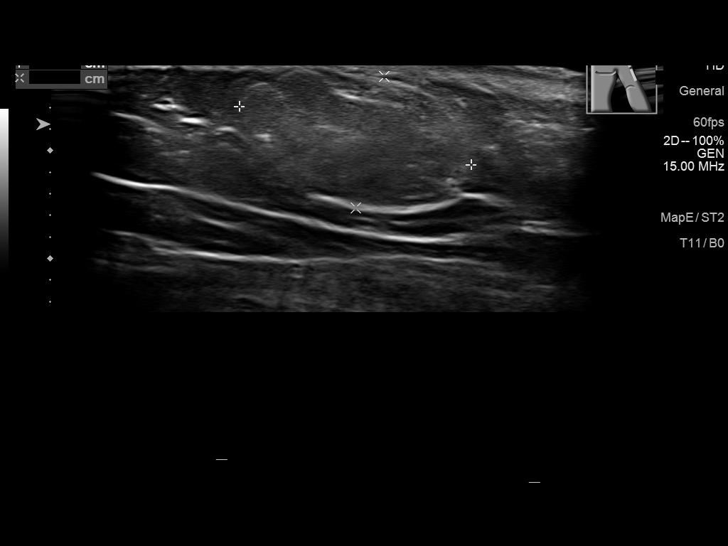
[im 12/12]
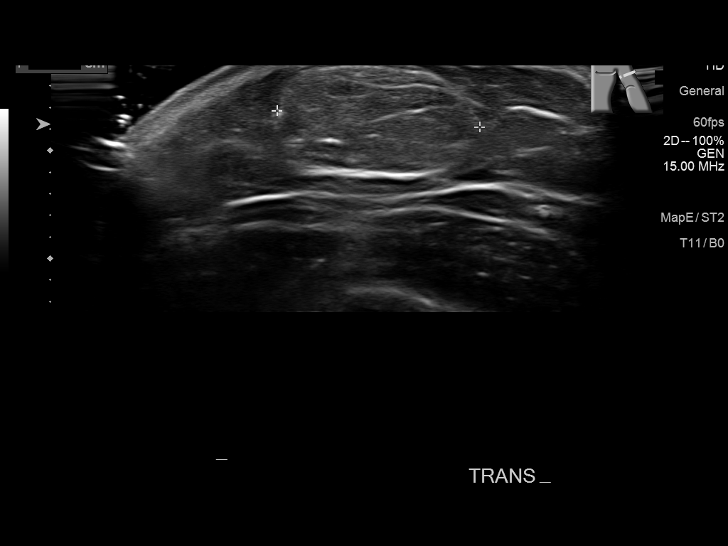

[12 of 12 positions shown; findings below may reference images not displayed]

FINDINGS: Scanning was directed toward the region of concern. A lesion
measuring 2.2 x 1.3 x 1.9 cm is identified. The lesion demonstrates
identical echogenicity to adjacent subcutaneous fat. Mild flow
within it on Doppler imaging is similar to adjacent fat. No other
abnormality is identified.
IMPRESSION: Findings most consistent with a simple lipoma in the region of
concern.

## 2022-08-22 DIAGNOSIS — M9902 Segmental and somatic dysfunction of thoracic region: Secondary | ICD-10-CM | POA: Diagnosis not present

## 2022-08-22 DIAGNOSIS — M5136 Other intervertebral disc degeneration, lumbar region: Secondary | ICD-10-CM | POA: Diagnosis not present

## 2022-08-22 DIAGNOSIS — M9901 Segmental and somatic dysfunction of cervical region: Secondary | ICD-10-CM | POA: Diagnosis not present

## 2022-08-22 DIAGNOSIS — M546 Pain in thoracic spine: Secondary | ICD-10-CM | POA: Diagnosis not present

## 2022-08-22 DIAGNOSIS — M9903 Segmental and somatic dysfunction of lumbar region: Secondary | ICD-10-CM | POA: Diagnosis not present

## 2022-08-22 DIAGNOSIS — M531 Cervicobrachial syndrome: Secondary | ICD-10-CM | POA: Diagnosis not present

## 2022-09-19 DIAGNOSIS — M9903 Segmental and somatic dysfunction of lumbar region: Secondary | ICD-10-CM | POA: Diagnosis not present

## 2022-09-19 DIAGNOSIS — M9902 Segmental and somatic dysfunction of thoracic region: Secondary | ICD-10-CM | POA: Diagnosis not present

## 2022-09-19 DIAGNOSIS — M531 Cervicobrachial syndrome: Secondary | ICD-10-CM | POA: Diagnosis not present

## 2022-09-19 DIAGNOSIS — M546 Pain in thoracic spine: Secondary | ICD-10-CM | POA: Diagnosis not present

## 2022-09-19 DIAGNOSIS — M5136 Other intervertebral disc degeneration, lumbar region: Secondary | ICD-10-CM | POA: Diagnosis not present

## 2022-09-19 DIAGNOSIS — M9901 Segmental and somatic dysfunction of cervical region: Secondary | ICD-10-CM | POA: Diagnosis not present

## 2022-09-27 DIAGNOSIS — I2510 Atherosclerotic heart disease of native coronary artery without angina pectoris: Secondary | ICD-10-CM | POA: Diagnosis not present

## 2022-09-27 DIAGNOSIS — E782 Mixed hyperlipidemia: Secondary | ICD-10-CM | POA: Diagnosis not present

## 2022-10-17 DIAGNOSIS — M546 Pain in thoracic spine: Secondary | ICD-10-CM | POA: Diagnosis not present

## 2022-10-17 DIAGNOSIS — M9901 Segmental and somatic dysfunction of cervical region: Secondary | ICD-10-CM | POA: Diagnosis not present

## 2022-10-17 DIAGNOSIS — M5136 Other intervertebral disc degeneration, lumbar region: Secondary | ICD-10-CM | POA: Diagnosis not present

## 2022-10-17 DIAGNOSIS — M531 Cervicobrachial syndrome: Secondary | ICD-10-CM | POA: Diagnosis not present

## 2022-10-17 DIAGNOSIS — M9902 Segmental and somatic dysfunction of thoracic region: Secondary | ICD-10-CM | POA: Diagnosis not present

## 2022-10-17 DIAGNOSIS — M9903 Segmental and somatic dysfunction of lumbar region: Secondary | ICD-10-CM | POA: Diagnosis not present

## 2022-11-01 DIAGNOSIS — H524 Presbyopia: Secondary | ICD-10-CM | POA: Diagnosis not present

## 2022-11-14 DIAGNOSIS — M531 Cervicobrachial syndrome: Secondary | ICD-10-CM | POA: Diagnosis not present

## 2022-11-14 DIAGNOSIS — M9902 Segmental and somatic dysfunction of thoracic region: Secondary | ICD-10-CM | POA: Diagnosis not present

## 2022-11-14 DIAGNOSIS — M5136 Other intervertebral disc degeneration, lumbar region: Secondary | ICD-10-CM | POA: Diagnosis not present

## 2022-11-14 DIAGNOSIS — M9901 Segmental and somatic dysfunction of cervical region: Secondary | ICD-10-CM | POA: Diagnosis not present

## 2022-11-14 DIAGNOSIS — M9903 Segmental and somatic dysfunction of lumbar region: Secondary | ICD-10-CM | POA: Diagnosis not present

## 2022-11-14 DIAGNOSIS — M546 Pain in thoracic spine: Secondary | ICD-10-CM | POA: Diagnosis not present

## 2022-11-22 DIAGNOSIS — H02403 Unspecified ptosis of bilateral eyelids: Secondary | ICD-10-CM | POA: Diagnosis not present

## 2022-11-22 DIAGNOSIS — Z961 Presence of intraocular lens: Secondary | ICD-10-CM | POA: Diagnosis not present

## 2022-11-22 DIAGNOSIS — H5213 Myopia, bilateral: Secondary | ICD-10-CM | POA: Diagnosis not present

## 2022-11-22 DIAGNOSIS — H26493 Other secondary cataract, bilateral: Secondary | ICD-10-CM | POA: Diagnosis not present

## 2022-11-22 DIAGNOSIS — H40003 Preglaucoma, unspecified, bilateral: Secondary | ICD-10-CM | POA: Diagnosis not present

## 2022-11-23 DIAGNOSIS — Z0100 Encounter for examination of eyes and vision without abnormal findings: Secondary | ICD-10-CM | POA: Diagnosis not present

## 2022-12-19 DIAGNOSIS — M9901 Segmental and somatic dysfunction of cervical region: Secondary | ICD-10-CM | POA: Diagnosis not present

## 2022-12-19 DIAGNOSIS — M9902 Segmental and somatic dysfunction of thoracic region: Secondary | ICD-10-CM | POA: Diagnosis not present

## 2022-12-19 DIAGNOSIS — M9903 Segmental and somatic dysfunction of lumbar region: Secondary | ICD-10-CM | POA: Diagnosis not present

## 2022-12-19 DIAGNOSIS — M531 Cervicobrachial syndrome: Secondary | ICD-10-CM | POA: Diagnosis not present

## 2022-12-19 DIAGNOSIS — M546 Pain in thoracic spine: Secondary | ICD-10-CM | POA: Diagnosis not present

## 2022-12-19 DIAGNOSIS — M5136 Other intervertebral disc degeneration, lumbar region: Secondary | ICD-10-CM | POA: Diagnosis not present

## 2023-01-22 DIAGNOSIS — L218 Other seborrheic dermatitis: Secondary | ICD-10-CM | POA: Diagnosis not present

## 2023-01-22 DIAGNOSIS — L570 Actinic keratosis: Secondary | ICD-10-CM | POA: Diagnosis not present

## 2023-01-30 DIAGNOSIS — M546 Pain in thoracic spine: Secondary | ICD-10-CM | POA: Diagnosis not present

## 2023-01-30 DIAGNOSIS — M9903 Segmental and somatic dysfunction of lumbar region: Secondary | ICD-10-CM | POA: Diagnosis not present

## 2023-01-30 DIAGNOSIS — M9902 Segmental and somatic dysfunction of thoracic region: Secondary | ICD-10-CM | POA: Diagnosis not present

## 2023-01-30 DIAGNOSIS — M9901 Segmental and somatic dysfunction of cervical region: Secondary | ICD-10-CM | POA: Diagnosis not present

## 2023-01-30 DIAGNOSIS — M531 Cervicobrachial syndrome: Secondary | ICD-10-CM | POA: Diagnosis not present

## 2023-01-30 DIAGNOSIS — M5136 Other intervertebral disc degeneration, lumbar region: Secondary | ICD-10-CM | POA: Diagnosis not present

## 2023-04-03 DIAGNOSIS — M546 Pain in thoracic spine: Secondary | ICD-10-CM | POA: Diagnosis not present

## 2023-04-03 DIAGNOSIS — M5136 Other intervertebral disc degeneration, lumbar region: Secondary | ICD-10-CM | POA: Diagnosis not present

## 2023-04-03 DIAGNOSIS — M9901 Segmental and somatic dysfunction of cervical region: Secondary | ICD-10-CM | POA: Diagnosis not present

## 2023-04-03 DIAGNOSIS — M9902 Segmental and somatic dysfunction of thoracic region: Secondary | ICD-10-CM | POA: Diagnosis not present

## 2023-04-03 DIAGNOSIS — M531 Cervicobrachial syndrome: Secondary | ICD-10-CM | POA: Diagnosis not present

## 2023-04-03 DIAGNOSIS — M9903 Segmental and somatic dysfunction of lumbar region: Secondary | ICD-10-CM | POA: Diagnosis not present

## 2023-05-23 DIAGNOSIS — I2510 Atherosclerotic heart disease of native coronary artery without angina pectoris: Secondary | ICD-10-CM | POA: Diagnosis not present

## 2023-05-23 DIAGNOSIS — Z0000 Encounter for general adult medical examination without abnormal findings: Secondary | ICD-10-CM | POA: Diagnosis not present

## 2023-05-23 DIAGNOSIS — M2550 Pain in unspecified joint: Secondary | ICD-10-CM | POA: Diagnosis not present

## 2023-05-23 DIAGNOSIS — M7062 Trochanteric bursitis, left hip: Secondary | ICD-10-CM | POA: Diagnosis not present

## 2023-05-24 DIAGNOSIS — Z0000 Encounter for general adult medical examination without abnormal findings: Secondary | ICD-10-CM | POA: Diagnosis not present

## 2023-05-24 DIAGNOSIS — I2510 Atherosclerotic heart disease of native coronary artery without angina pectoris: Secondary | ICD-10-CM | POA: Diagnosis not present

## 2023-05-24 DIAGNOSIS — M2550 Pain in unspecified joint: Secondary | ICD-10-CM | POA: Diagnosis not present

## 2023-06-05 DIAGNOSIS — I2510 Atherosclerotic heart disease of native coronary artery without angina pectoris: Secondary | ICD-10-CM | POA: Diagnosis not present

## 2023-06-05 DIAGNOSIS — Z955 Presence of coronary angioplasty implant and graft: Secondary | ICD-10-CM | POA: Diagnosis not present

## 2023-06-05 DIAGNOSIS — E782 Mixed hyperlipidemia: Secondary | ICD-10-CM | POA: Diagnosis not present

## 2023-07-10 DIAGNOSIS — M9901 Segmental and somatic dysfunction of cervical region: Secondary | ICD-10-CM | POA: Diagnosis not present

## 2023-07-10 DIAGNOSIS — M531 Cervicobrachial syndrome: Secondary | ICD-10-CM | POA: Diagnosis not present

## 2023-07-10 DIAGNOSIS — M9903 Segmental and somatic dysfunction of lumbar region: Secondary | ICD-10-CM | POA: Diagnosis not present

## 2023-07-10 DIAGNOSIS — M545 Low back pain, unspecified: Secondary | ICD-10-CM | POA: Diagnosis not present

## 2023-07-10 DIAGNOSIS — M9902 Segmental and somatic dysfunction of thoracic region: Secondary | ICD-10-CM | POA: Diagnosis not present

## 2023-07-10 DIAGNOSIS — M546 Pain in thoracic spine: Secondary | ICD-10-CM | POA: Diagnosis not present

## 2023-08-20 DIAGNOSIS — M9903 Segmental and somatic dysfunction of lumbar region: Secondary | ICD-10-CM | POA: Diagnosis not present

## 2023-08-20 DIAGNOSIS — M545 Low back pain, unspecified: Secondary | ICD-10-CM | POA: Diagnosis not present

## 2023-08-20 DIAGNOSIS — M9902 Segmental and somatic dysfunction of thoracic region: Secondary | ICD-10-CM | POA: Diagnosis not present

## 2023-08-20 DIAGNOSIS — M531 Cervicobrachial syndrome: Secondary | ICD-10-CM | POA: Diagnosis not present

## 2023-08-20 DIAGNOSIS — M9901 Segmental and somatic dysfunction of cervical region: Secondary | ICD-10-CM | POA: Diagnosis not present

## 2023-08-20 DIAGNOSIS — M546 Pain in thoracic spine: Secondary | ICD-10-CM | POA: Diagnosis not present

## 2023-09-18 DIAGNOSIS — M9902 Segmental and somatic dysfunction of thoracic region: Secondary | ICD-10-CM | POA: Diagnosis not present

## 2023-09-18 DIAGNOSIS — M9901 Segmental and somatic dysfunction of cervical region: Secondary | ICD-10-CM | POA: Diagnosis not present

## 2023-09-18 DIAGNOSIS — M546 Pain in thoracic spine: Secondary | ICD-10-CM | POA: Diagnosis not present

## 2023-09-18 DIAGNOSIS — M9903 Segmental and somatic dysfunction of lumbar region: Secondary | ICD-10-CM | POA: Diagnosis not present

## 2023-09-18 DIAGNOSIS — M531 Cervicobrachial syndrome: Secondary | ICD-10-CM | POA: Diagnosis not present

## 2023-09-18 DIAGNOSIS — M545 Low back pain, unspecified: Secondary | ICD-10-CM | POA: Diagnosis not present

## 2023-09-30 DIAGNOSIS — R42 Dizziness and giddiness: Secondary | ICD-10-CM | POA: Diagnosis not present

## 2023-10-17 DIAGNOSIS — R42 Dizziness and giddiness: Secondary | ICD-10-CM | POA: Diagnosis not present

## 2023-11-13 DIAGNOSIS — M9902 Segmental and somatic dysfunction of thoracic region: Secondary | ICD-10-CM | POA: Diagnosis not present

## 2023-11-13 DIAGNOSIS — M531 Cervicobrachial syndrome: Secondary | ICD-10-CM | POA: Diagnosis not present

## 2023-11-13 DIAGNOSIS — M545 Low back pain, unspecified: Secondary | ICD-10-CM | POA: Diagnosis not present

## 2023-11-13 DIAGNOSIS — M9903 Segmental and somatic dysfunction of lumbar region: Secondary | ICD-10-CM | POA: Diagnosis not present

## 2023-11-13 DIAGNOSIS — M546 Pain in thoracic spine: Secondary | ICD-10-CM | POA: Diagnosis not present

## 2023-11-13 DIAGNOSIS — M9901 Segmental and somatic dysfunction of cervical region: Secondary | ICD-10-CM | POA: Diagnosis not present

## 2023-11-17 ENCOUNTER — Other Ambulatory Visit: Payer: Self-pay

## 2023-11-17 ENCOUNTER — Emergency Department
Admission: EM | Admit: 2023-11-17 | Discharge: 2023-11-17 | Disposition: A | Discharge status: Home / Self Care | Attending: Emergency Medicine | Admitting: Emergency Medicine

## 2023-11-17 ENCOUNTER — Emergency Department

## 2023-11-17 DIAGNOSIS — R0789 Other chest pain: Secondary | ICD-10-CM | POA: Diagnosis not present

## 2023-11-17 DIAGNOSIS — R079 Chest pain, unspecified: Secondary | ICD-10-CM | POA: Diagnosis not present

## 2023-11-17 DIAGNOSIS — I2510 Atherosclerotic heart disease of native coronary artery without angina pectoris: Secondary | ICD-10-CM | POA: Insufficient documentation

## 2023-11-17 LAB — BASIC METABOLIC PANEL WITH GFR
Anion gap: 8 (ref 5–15)
BUN: 16 mg/dL (ref 8–23)
CO2: 23 mmol/L (ref 22–32)
Calcium: 9 mg/dL (ref 8.9–10.3)
Chloride: 105 mmol/L (ref 98–111)
Creatinine, Ser: 1.05 mg/dL (ref 0.61–1.24)
GFR, Estimated: >60 mL/min (ref >60)
Glucose, Bld: 149 mg/dL — ABNORMAL HIGH (ref 70–99)
Potassium: 3.6 mmol/L (ref 3.5–5.1)
Sodium: 136 mmol/L (ref 135–145)

## 2023-11-17 LAB — CBC
HCT: 45.8 % (ref 39.0–52.0)
Hemoglobin: 15.5 g/dL (ref 13.0–17.0)
MCH: 30.9 pg (ref 26.0–34.0)
MCHC: 33.8 g/dL (ref 30.0–36.0)
MCV: 91.2 fL (ref 80.0–100.0)
Platelets: 147 10*3/uL — ABNORMAL LOW (ref 150–400)
RBC: 5.02 MIL/uL (ref 4.22–5.81)
RDW: 13.3 % (ref 11.5–15.5)
WBC: 5.3 10*3/uL (ref 4.0–10.5)
nRBC: 0 % (ref 0.0–0.2)

## 2023-11-17 LAB — TROPONIN I (HIGH SENSITIVITY): Troponin I (High Sensitivity): 4 ng/L (ref <18)

## 2023-11-17 MED ORDER — ASPIRIN 81 MG PO CHEW
324.0000 mg | CHEWABLE_TABLET | Freq: Once | ORAL | Status: AC
  Administered 2023-11-17: 324 mg via ORAL
  Filled 2023-11-17: qty 4

## 2023-11-17 NOTE — ED Triage Notes (Signed)
 Pt states CP that started 2 days ago, pain started Friday, pt states pain is R chest with no radiation. Pt states 1 stent placed in 2023. Pt denies SOB, N/V. NAD noted.

## 2023-11-17 NOTE — ED Provider Notes (Signed)
 [facility] Provider Note    Event Date/Time   First MD Initiated Contact with Patient 11/17/23 1017     (approximate)   History   Chief Complaint Chest Pain   HPI  Jacob Zhang is a 83 y.o. male with past medical history of hyperlipidemia and CAD who presents to the ED complaining of chest pain.  Patient reports that he has had 2 days of sharp pain in the right side of his chest that is present constantly but seems to be exacerbated by certain movements.  He states that the pain is worse when he bends over, also became worse when he lifted up his right arm in the shower this morning.  He denies any fevers or cough, has not had any difficulty breathing.  He denies any pain or swelling in his legs.  He states that he plays golf regularly, denies any other physical exertion or heavy lifting.  He had a stent placed in his heart a couple of years ago, has not had any issues since then.     Physical Exam   Triage Vital Signs: ED Triage Vitals  Encounter Vitals Group     BP 11/17/23 1015 (!) 161/99     Systolic BP Percentile --      Diastolic BP Percentile --      Pulse Rate 11/17/23 1015 73     Resp 11/17/23 1015 18     Temp 11/17/23 1015 98.7 F (37.1 C)     Temp Source 11/17/23 1015 Oral     SpO2 11/17/23 1015 98 %     Weight 11/17/23 1013 163 lb (73.9 kg)     Height 11/17/23 1013 5\' 9"  (1.753 m)     Head Circumference --      Peak Flow --      Pain Score 11/17/23 1013 5     Pain Loc --      Pain Education --      Exclude from Growth Chart --     Most recent vital signs: Vitals:   11/17/23 1100 11/17/23 1115  BP: (!) 148/82 (!) 149/78  Pulse: 71 62  Resp: (!) 21 (!) 22  Temp:    SpO2: 100% 98%    Constitutional: Alert and oriented. Eyes: Conjunctivae are normal. Head: Atraumatic. Nose: No congestion/rhinnorhea. Mouth/Throat: Mucous membranes are moist.  Cardiovascular: Normal rate, regular rhythm. Grossly normal heart sounds.  2+  radial pulses bilaterally. Respiratory: Normal respiratory effort.  No retractions. Lungs CTAB.  Right chest wall tenderness to palpation noted. Gastrointestinal: Soft and nontender. No distention. Musculoskeletal: No lower extremity tenderness nor edema.  Neurologic:  Normal speech and language. No gross focal neurologic deficits are appreciated.    ED Results / Procedures / Treatments   Labs (all labs ordered are listed, but only abnormal results are displayed) Labs Reviewed  BASIC METABOLIC PANEL WITH GFR - Abnormal; Notable for the following components:      Result Value   Glucose, Bld 149 (*)    All other components within normal limits  CBC - Abnormal; Notable for the following components:   Platelets 147 (*)    All other components within normal limits  TROPONIN I (HIGH SENSITIVITY)     EKG  ED ECG REPORT I, Twilla Galea, the attending physician, personally viewed and interpreted this ECG.   Date: 11/17/2023  EKG Time: 10:14  Rate: 79  Rhythm: normal sinus rhythm  Axis: Normal  Intervals:none  ST&T Change: None  RADIOLOGY Chest x-ray reviewed and interpreted by me with no infiltrate, edema, or effusion.  PROCEDURES:  Critical Care performed: No  Procedures   MEDICATIONS ORDERED IN ED: Medications  aspirin  chewable tablet 324 mg (324 mg Oral Given 11/17/23 1035)     IMPRESSION / MDM / ASSESSMENT AND PLAN / ED COURSE  I reviewed the triage vital signs and the nursing notes.                              83 y.o. male with past medical history of hyperlipidemia and CAD who presents to the ED with sharp pain in the right side of his chest that is worse with certain movements over the past 2 days.  Patient's presentation is most consistent with acute presentation with potential threat to life or bodily function.  Differential diagnosis includes, but is not limited to, ACS, PE, pneumonia, pneumothorax, musculoskeletal pain, GERD, anxiety.  Patient  nontoxic-appearing and in no acute distress, vital signs are unremarkable.  EKG shows no evidence of arrhythmia or ischemia, chest x-ray and labs are pending at this time.  Symptoms do seem atypical for ACS given pain with certain movements that is reproducible on palpation.  Chest x-ray is unremarkable, labs without significant anemia, leukocytosis, electrolyte abnormality, or AKI.  Troponin within normal limits and I doubt ACS given multiple days of constant symptoms as well as atypical pain that is reproducible on palpation.  He is appropriate for discharge home with outpatient cardiology follow-up, was counseled to return to the ED for new or worsening symptoms.  Patient agrees with plan.      FINAL CLINICAL IMPRESSION(S) / ED DIAGNOSES   Final diagnoses:  Nonspecific chest pain     Rx / DC Orders   ED Discharge Orders          Ordered    Ambulatory referral to Cardiology        11/17/23 1134             Note:  This document was prepared using Dragon voice recognition software and may include unintentional dictation errors.   Twilla Galea, MD 11/17/23 1135

## 2023-12-03 DIAGNOSIS — Z1331 Encounter for screening for depression: Secondary | ICD-10-CM | POA: Diagnosis not present

## 2023-12-03 DIAGNOSIS — M791 Myalgia, unspecified site: Secondary | ICD-10-CM | POA: Diagnosis not present

## 2023-12-18 DIAGNOSIS — M9902 Segmental and somatic dysfunction of thoracic region: Secondary | ICD-10-CM | POA: Diagnosis not present

## 2023-12-18 DIAGNOSIS — M545 Low back pain, unspecified: Secondary | ICD-10-CM | POA: Diagnosis not present

## 2023-12-18 DIAGNOSIS — M9901 Segmental and somatic dysfunction of cervical region: Secondary | ICD-10-CM | POA: Diagnosis not present

## 2023-12-18 DIAGNOSIS — M531 Cervicobrachial syndrome: Secondary | ICD-10-CM | POA: Diagnosis not present

## 2023-12-18 DIAGNOSIS — M546 Pain in thoracic spine: Secondary | ICD-10-CM | POA: Diagnosis not present

## 2023-12-18 DIAGNOSIS — M9903 Segmental and somatic dysfunction of lumbar region: Secondary | ICD-10-CM | POA: Diagnosis not present

## 2023-12-25 DIAGNOSIS — Z955 Presence of coronary angioplasty implant and graft: Secondary | ICD-10-CM | POA: Diagnosis not present

## 2023-12-25 DIAGNOSIS — I2510 Atherosclerotic heart disease of native coronary artery without angina pectoris: Secondary | ICD-10-CM | POA: Diagnosis not present

## 2023-12-25 DIAGNOSIS — E782 Mixed hyperlipidemia: Secondary | ICD-10-CM | POA: Diagnosis not present

## 2024-01-08 DIAGNOSIS — Z1331 Encounter for screening for depression: Secondary | ICD-10-CM | POA: Diagnosis not present

## 2024-01-08 DIAGNOSIS — S20212A Contusion of left front wall of thorax, initial encounter: Secondary | ICD-10-CM | POA: Diagnosis not present

## 2024-01-08 DIAGNOSIS — M25522 Pain in left elbow: Secondary | ICD-10-CM | POA: Diagnosis not present

## 2024-01-15 DIAGNOSIS — M9901 Segmental and somatic dysfunction of cervical region: Secondary | ICD-10-CM | POA: Diagnosis not present

## 2024-01-15 DIAGNOSIS — M531 Cervicobrachial syndrome: Secondary | ICD-10-CM | POA: Diagnosis not present

## 2024-01-15 DIAGNOSIS — M9902 Segmental and somatic dysfunction of thoracic region: Secondary | ICD-10-CM | POA: Diagnosis not present

## 2024-01-15 DIAGNOSIS — M545 Low back pain, unspecified: Secondary | ICD-10-CM | POA: Diagnosis not present

## 2024-01-15 DIAGNOSIS — M9903 Segmental and somatic dysfunction of lumbar region: Secondary | ICD-10-CM | POA: Diagnosis not present

## 2024-01-15 DIAGNOSIS — M546 Pain in thoracic spine: Secondary | ICD-10-CM | POA: Diagnosis not present

## 2024-02-12 DIAGNOSIS — M531 Cervicobrachial syndrome: Secondary | ICD-10-CM | POA: Diagnosis not present

## 2024-02-12 DIAGNOSIS — M9903 Segmental and somatic dysfunction of lumbar region: Secondary | ICD-10-CM | POA: Diagnosis not present

## 2024-02-12 DIAGNOSIS — M545 Low back pain, unspecified: Secondary | ICD-10-CM | POA: Diagnosis not present

## 2024-02-12 DIAGNOSIS — M9902 Segmental and somatic dysfunction of thoracic region: Secondary | ICD-10-CM | POA: Diagnosis not present

## 2024-02-12 DIAGNOSIS — M546 Pain in thoracic spine: Secondary | ICD-10-CM | POA: Diagnosis not present

## 2024-02-12 DIAGNOSIS — M9901 Segmental and somatic dysfunction of cervical region: Secondary | ICD-10-CM | POA: Diagnosis not present

## 2024-03-11 DIAGNOSIS — M9903 Segmental and somatic dysfunction of lumbar region: Secondary | ICD-10-CM | POA: Diagnosis not present

## 2024-03-11 DIAGNOSIS — M546 Pain in thoracic spine: Secondary | ICD-10-CM | POA: Diagnosis not present

## 2024-03-11 DIAGNOSIS — M9901 Segmental and somatic dysfunction of cervical region: Secondary | ICD-10-CM | POA: Diagnosis not present

## 2024-03-11 DIAGNOSIS — M9902 Segmental and somatic dysfunction of thoracic region: Secondary | ICD-10-CM | POA: Diagnosis not present

## 2024-03-11 DIAGNOSIS — M545 Low back pain, unspecified: Secondary | ICD-10-CM | POA: Diagnosis not present

## 2024-03-11 DIAGNOSIS — M531 Cervicobrachial syndrome: Secondary | ICD-10-CM | POA: Diagnosis not present

## 2024-04-08 DIAGNOSIS — M531 Cervicobrachial syndrome: Secondary | ICD-10-CM | POA: Diagnosis not present

## 2024-04-08 DIAGNOSIS — M9903 Segmental and somatic dysfunction of lumbar region: Secondary | ICD-10-CM | POA: Diagnosis not present

## 2024-04-08 DIAGNOSIS — M9902 Segmental and somatic dysfunction of thoracic region: Secondary | ICD-10-CM | POA: Diagnosis not present

## 2024-04-08 DIAGNOSIS — M546 Pain in thoracic spine: Secondary | ICD-10-CM | POA: Diagnosis not present

## 2024-04-08 DIAGNOSIS — M9901 Segmental and somatic dysfunction of cervical region: Secondary | ICD-10-CM | POA: Diagnosis not present

## 2024-04-08 DIAGNOSIS — M545 Low back pain, unspecified: Secondary | ICD-10-CM | POA: Diagnosis not present
